# Patient Record
Sex: Female | Born: 1985 | Race: White | Hispanic: No | Smoking: Current every day smoker
Health system: Southern US, Community
[De-identification: ages and names within clinical notes are randomized; demographics above are authoritative.]

## PROBLEM LIST (undated history)

## (undated) DIAGNOSIS — IMO0002 Reserved for concepts with insufficient information to code with codable children: Secondary | ICD-10-CM

## (undated) DIAGNOSIS — M329 Systemic lupus erythematosus, unspecified: Secondary | ICD-10-CM

## (undated) DIAGNOSIS — M1990 Unspecified osteoarthritis, unspecified site: Secondary | ICD-10-CM

## (undated) DIAGNOSIS — M48061 Spinal stenosis, lumbar region without neurogenic claudication: Secondary | ICD-10-CM

## (undated) DIAGNOSIS — B182 Chronic viral hepatitis C: Secondary | ICD-10-CM

## (undated) HISTORY — PX: CHOLECYSTECTOMY: SHX55

## (undated) HISTORY — PX: LIVER BIOPSY: SHX301

---

## 2015-08-18 ENCOUNTER — Emergency Department (HOSPITAL_COMMUNITY): Payer: Medicaid - Out of State

## 2015-08-18 ENCOUNTER — Encounter (HOSPITAL_COMMUNITY): Payer: Self-pay

## 2015-08-18 ENCOUNTER — Emergency Department (HOSPITAL_COMMUNITY)
Admission: EM | Admit: 2015-08-18 | Discharge: 2015-08-18 | Disposition: A | Discharge status: Home / Self Care | Payer: Medicaid - Out of State | Attending: Emergency Medicine | Admitting: Emergency Medicine

## 2015-08-18 DIAGNOSIS — S300XXA Contusion of lower back and pelvis, initial encounter: Secondary | ICD-10-CM | POA: Diagnosis not present

## 2015-08-18 DIAGNOSIS — F17210 Nicotine dependence, cigarettes, uncomplicated: Secondary | ICD-10-CM | POA: Diagnosis not present

## 2015-08-18 DIAGNOSIS — T148XXA Other injury of unspecified body region, initial encounter: Secondary | ICD-10-CM

## 2015-08-18 DIAGNOSIS — Z8739 Personal history of other diseases of the musculoskeletal system and connective tissue: Secondary | ICD-10-CM | POA: Insufficient documentation

## 2015-08-18 DIAGNOSIS — W010XXA Fall on same level from slipping, tripping and stumbling without subsequent striking against object, initial encounter: Secondary | ICD-10-CM | POA: Insufficient documentation

## 2015-08-18 DIAGNOSIS — T148 Other injury of unspecified body region: Secondary | ICD-10-CM | POA: Diagnosis not present

## 2015-08-18 DIAGNOSIS — Z8619 Personal history of other infectious and parasitic diseases: Secondary | ICD-10-CM | POA: Diagnosis not present

## 2015-08-18 DIAGNOSIS — Y998 Other external cause status: Secondary | ICD-10-CM | POA: Diagnosis not present

## 2015-08-18 DIAGNOSIS — Y92003 Bedroom of unspecified non-institutional (private) residence as the place of occurrence of the external cause: Secondary | ICD-10-CM | POA: Insufficient documentation

## 2015-08-18 DIAGNOSIS — S20212A Contusion of left front wall of thorax, initial encounter: Secondary | ICD-10-CM | POA: Insufficient documentation

## 2015-08-18 DIAGNOSIS — Y9389 Activity, other specified: Secondary | ICD-10-CM | POA: Diagnosis not present

## 2015-08-18 DIAGNOSIS — S3992XA Unspecified injury of lower back, initial encounter: Secondary | ICD-10-CM | POA: Diagnosis present

## 2015-08-18 HISTORY — DX: Chronic viral hepatitis C: B18.2

## 2015-08-18 HISTORY — DX: Spinal stenosis, lumbar region without neurogenic claudication: M48.061

## 2015-08-18 HISTORY — DX: Systemic lupus erythematosus, unspecified: M32.9

## 2015-08-18 HISTORY — DX: Reserved for concepts with insufficient information to code with codable children: IMO0002

## 2015-08-18 HISTORY — DX: Unspecified osteoarthritis, unspecified site: M19.90

## 2015-08-18 LAB — POC URINE PREG, ED: Preg Test, Ur: NEGATIVE

## 2015-08-18 MED ORDER — METHOCARBAMOL 500 MG PO TABS: 500.0000 mg | ORAL_TABLET | Freq: Three times a day (TID) | ORAL | Status: AC

## 2015-08-18 MED ORDER — OXYCODONE HCL 5 MG PO TABS: 5.0000 mg | ORAL_TABLET | Freq: Four times a day (QID) | ORAL | Status: AC | PRN

## 2015-08-18 MED ORDER — PROMETHAZINE HCL 12.5 MG PO TABS
25.0000 mg | ORAL_TABLET | Freq: Once | ORAL | Status: DC
  Filled 2015-08-18: qty 2

## 2015-08-18 MED ORDER — MORPHINE SULFATE (PF) 10 MG/ML IV SOLN
10.0000 mg | Freq: Once | INTRAVENOUS | Status: AC
  Administered 2015-08-18: 10 mg via INTRAMUSCULAR
  Filled 2015-08-18: qty 1

## 2015-08-18 MED ORDER — DIAZEPAM 5 MG PO TABS
10.0000 mg | ORAL_TABLET | Freq: Once | ORAL | Status: AC
  Administered 2015-08-18: 10 mg via ORAL
  Filled 2015-08-18: qty 2

## 2015-08-18 NOTE — Discharge Instructions (Signed)
Your vital signs are nonacute. The x-rays of your ribs, chest, lumbar spine, and thoracic spine are all negative for acute changes. Please rest your back is much as possible. Please use Robaxin 3 times daily, may use Roxicodone for more severe pain. This medication may cause drowsiness, and/or constipation. Please use a stool softener when taking this medication. Please see your primary care physician, or the physicians at the [facility] to establish a primary care physician and assist with any follow-up. Contusion A contusion is a deep bruise. Contusions are the result of a blunt injury to tissues and muscle fibers under the skin. The injury causes bleeding under the skin. The skin overlying the contusion may turn blue, purple, or yellow. Minor injuries will give you a painless contusion, but more severe contusions may stay painful and swollen for a few weeks.  CAUSES  This condition is usually caused by a blow, trauma, or direct force to an area of the body. SYMPTOMS  Symptoms of this condition include:  Swelling of the injured area.  Pain and tenderness in the injured area.  Discoloration. The area may have redness and then turn blue, purple, or yellow. DIAGNOSIS  This condition is diagnosed based on a physical exam and medical history. An X-ray, CT scan, or MRI may be needed to determine if there are any associated injuries, such as broken bones (fractures). TREATMENT  Specific treatment for this condition depends on what area of the body was injured. In general, the best treatment for a contusion is resting, icing, applying pressure to (compression), and elevating the injured area. This is often called the RICE strategy. Over-the-counter anti-inflammatory medicines may also be recommended for pain control.  HOME CARE INSTRUCTIONS   Rest the injured area.  If directed, apply ice to the injured area:  Put ice in a plastic bag.  Place a towel between your skin and the  bag.  Leave the ice on for 20 minutes, 2-3 times per day.  If directed, apply light compression to the injured area using an elastic bandage. Make sure the bandage is not wrapped too tightly. Remove and reapply the bandage as directed by your health care provider.  If possible, raise (elevate) the injured area above the level of your heart while you are sitting or lying down.  Take over-the-counter and prescription medicines only as told by your health care provider. SEEK MEDICAL CARE IF:  Your symptoms do not improve after several days of treatment.  Your symptoms get worse.  You have difficulty moving the injured area. SEEK IMMEDIATE MEDICAL CARE IF:   You have severe pain.  You have numbness in a hand or foot.  Your hand or foot turns pale or cold.   This information is not intended to replace advice given to you by your health care provider. Make sure you discuss any questions you have with your health care provider.   Document Released: 03/11/2005 Document Revised: 02/20/2015 Document Reviewed: 10/17/2014 Elsevier Interactive Patient Education [employer]2016 [employer]

## 2015-08-18 NOTE — ED Notes (Signed)
Pt reports a miscarriage 2 weeks ago and declines a urine pregnancy test

## 2015-08-18 NOTE — ED Provider Notes (Signed)
CSN: [id number]     Arrival date & time 08/18/15  2012 History   First MD Initiated Contact with Patient 08/18/15 2113     Chief Complaint  Patient presents with  . Back Pain     (Consider location/radiation/quality/duration/timing/severity/associated sxs/prior Treatment) HPI Comments: Patient is a 30 year old female who presents to the emergency department with complaint of back pain and rib pain.  The patient states that on last evening she tripped in a bedroom, and fell on a table, trash can, and heater. She did not have immediate pain, but as the night progressed she had more and more pain. This a.m. she could hardly move, and has pain with movement, pain with walking. She has not had any vomiting. She did not have any loss of consciousness on last night. There's been no blood in the urine. There's been no blood in the stool. The patient denies any use of anticoagulation medications. It is of note that she has a history of lumbar stenosis and arthritis.  Patient is a 30 y.o. female presenting with back pain. The history is provided by the patient.  Back Pain Location:  Thoracic spine and lumbar spine   Past Medical History  Diagnosis Date  . Lupus (HCC)   . Lumbar stenosis   . Arthritis   . Hep C w/o coma, chronic (HCC)    Past Surgical History  Procedure Laterality Date  . Cesarean section    . Cholecystectomy    . Liver biopsy     History reviewed. No pertinent family history. Social History  Substance Use Topics  . Smoking status: Current Every Day Smoker    Types: Cigarettes  . Smokeless tobacco: None  . Alcohol Use: Yes     Comment: occ   OB History    No data available     Review of Systems  Musculoskeletal: Positive for back pain and arthralgias.  All other systems reviewed and are negative.     Allergies  Nsaids; Tramadol; and Tylenol  Home Medications   Prior to Admission medications   Not on File   BP 114/51 mmHg  Pulse 100  Temp(Src) 97.8 F  (36.6 C) (Oral)  Resp 18  Ht 5\' 1"  (1.549 m)  Wt 79.379 kg  BMI 33.08 kg/m2  SpO2 100%  LMP 08/12/2015 Physical Exam  Constitutional: She is oriented to person, place, and time. She appears well-developed and well-nourished.  Non-toxic appearance.  HENT:  Head: Normocephalic.  Right Ear: Tympanic membrane and external ear normal.  Left Ear: Tympanic membrane and external ear normal.  Eyes: EOM and lids are normal. Pupils are equal, round, and reactive to light.  Neck: Normal range of motion. Neck supple. Carotid bruit is not present.  Cardiovascular: Normal rate, regular rhythm, normal heart sounds, intact distal pulses and normal pulses.   Pulmonary/Chest: Breath sounds normal. No respiratory distress.  There is pain to the left lateral/posterior ribs. There is symmetrical rise and fall of the chest. The patient speaks in complete sentences.  Abdominal: Soft. Bowel sounds are normal. There is no tenderness. There is no guarding.  Musculoskeletal: Normal range of motion.  There is pain to palpation of the lower thoracic, entire lumbar area. There is spasm noted of the paraspinal muscle areas of the lumbar region. The examination is limited, but there is no palpable step off. Patient has complaint of pain with even light touch to the lower back.  Lymphadenopathy:       Head (right side): No submandibular  adenopathy present.       Head (left side): No submandibular adenopathy present.    She has no cervical adenopathy.  Neurological: She is alert and oriented to person, place, and time. She has normal strength. No cranial nerve deficit or sensory deficit. She exhibits normal muscle tone. Coordination normal.  Patient would not cooperate for ambulation. No gross deficits appreciated on limited examination.  Skin: Skin is warm and dry.  Psychiatric: She has a normal mood and affect. Her speech is normal.  Nursing note and vitals reviewed.   ED Course  Procedures (including critical care  time) Labs Review Labs Reviewed  POC URINE PREG, ED    Imaging Review Dg Ribs Unilateral W/chest Left  08/18/2015  CLINICAL DATA:  Fall with left-sided rib pain. Patient tripped and fell onto a table yesterday. EXAM: LEFT RIBS AND CHEST - 3+ VIEW COMPARISON:  None. FINDINGS: Normal heart size and pulmonary vascularity. No focal airspace disease or consolidation in the lungs. No blunting of costophrenic angles. No pneumothorax. Mediastinal contours appear intact. Left ribs appear intact. No evidence of acute fracture or dislocation. No focal bone lesion or bone destruction. IMPRESSION: No evidence of active pulmonary disease.  Negative left ribs. Electronically Signed   By: Burman Nieves M.D.   On: 08/18/2015 22:33   Dg Thoracic Spine 2 View  08/18/2015  CLINICAL DATA:  Trip and fall onto the table, trash can, and space heater yesterday. Now with thoracolumbar back pain today. EXAM: THORACIC SPINE 2 VIEWS COMPARISON:  None. FINDINGS: The alignment is maintained. Vertebral body heights are maintained. No significant disc space narrowing. Posterior elements appear intact. No evidence of fracture. There is no paravertebral soft tissue abnormality. IMPRESSION: Negative radiographs of the thoracic spine. Electronically Signed   By: Rubye Oaks M.D.   On: 08/18/2015 22:34   Dg Lumbar Spine Complete  08/18/2015  CLINICAL DATA:  Trip and fall onto a table, trash can, and space heater yesterday. Now with thoracic and lumbar back pain. EXAM: LUMBAR SPINE - COMPLETE 4+ VIEW COMPARISON:  None. FINDINGS: The alignment is maintained. Vertebral body heights are normal. There is no listhesis. The posterior elements are intact. Minimal disc space narrowing at L5-S1, remaining disc spaces are preserved spaces are preserved. No fracture. Sacroiliac joints are symmetric and normal. IMPRESSION: No fracture or subluxation of the lumbar spine. Electronically Signed   By: Rubye Oaks M.D.   On: 08/18/2015 22:33   I  have personally reviewed and evaluated these images and lab results as part of my medical decision-making.   EKG Interpretation None      MDM  Vital signs are well within normal limits. Pulse oximetry is 100% on room air. X-ray of the left ribs and chest is read as negative. X-ray of the thoracic spine is read as negative for fracture or dislocation. X-ray of the lumbar spine is read as negative for fracture or subluxation. The patient will be treated with muscle relaxer and Roxicodone, as the patient has multiple allergies to medications.    Final diagnoses:  None    *I have reviewed nursing notes, vital signs, and all appropriate lab and imaging results for this patient.Ivery Quale, PA-C 08/18/15 2247  Samuel Jester, DO 08/21/15 [phone]

## 2015-08-18 NOTE — ED Notes (Signed)
Patient states she tripped and fell onto a table, trash can, and a space heater. Denies LOC, c/o mid/lower back.

## 2017-06-02 IMAGING — DX DG RIBS W/ CHEST 3+V*L*
4 series · 4 of 4 positions shown · non-contrast
Comparison: None.

CLINICAL DATA: Fall with left-sided rib pain. Patient tripped and
fell onto a table yesterday.

EXAM:
LEFT RIBS AND CHEST - 3+ VIEW

[chest pa]
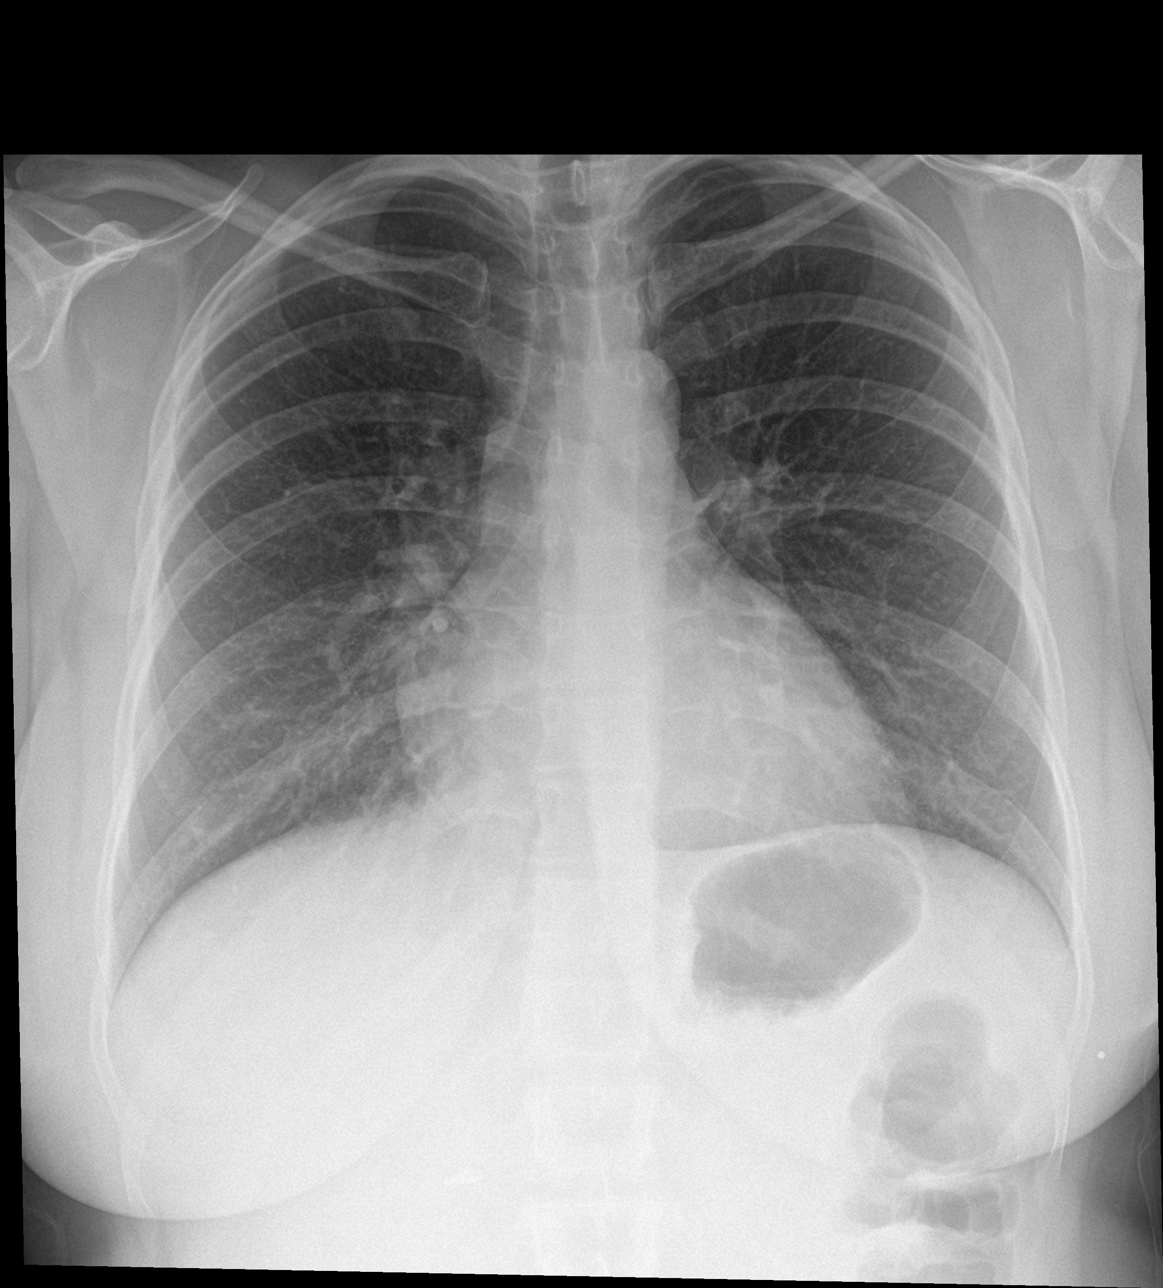

[rib pa obl (1 of 2)]
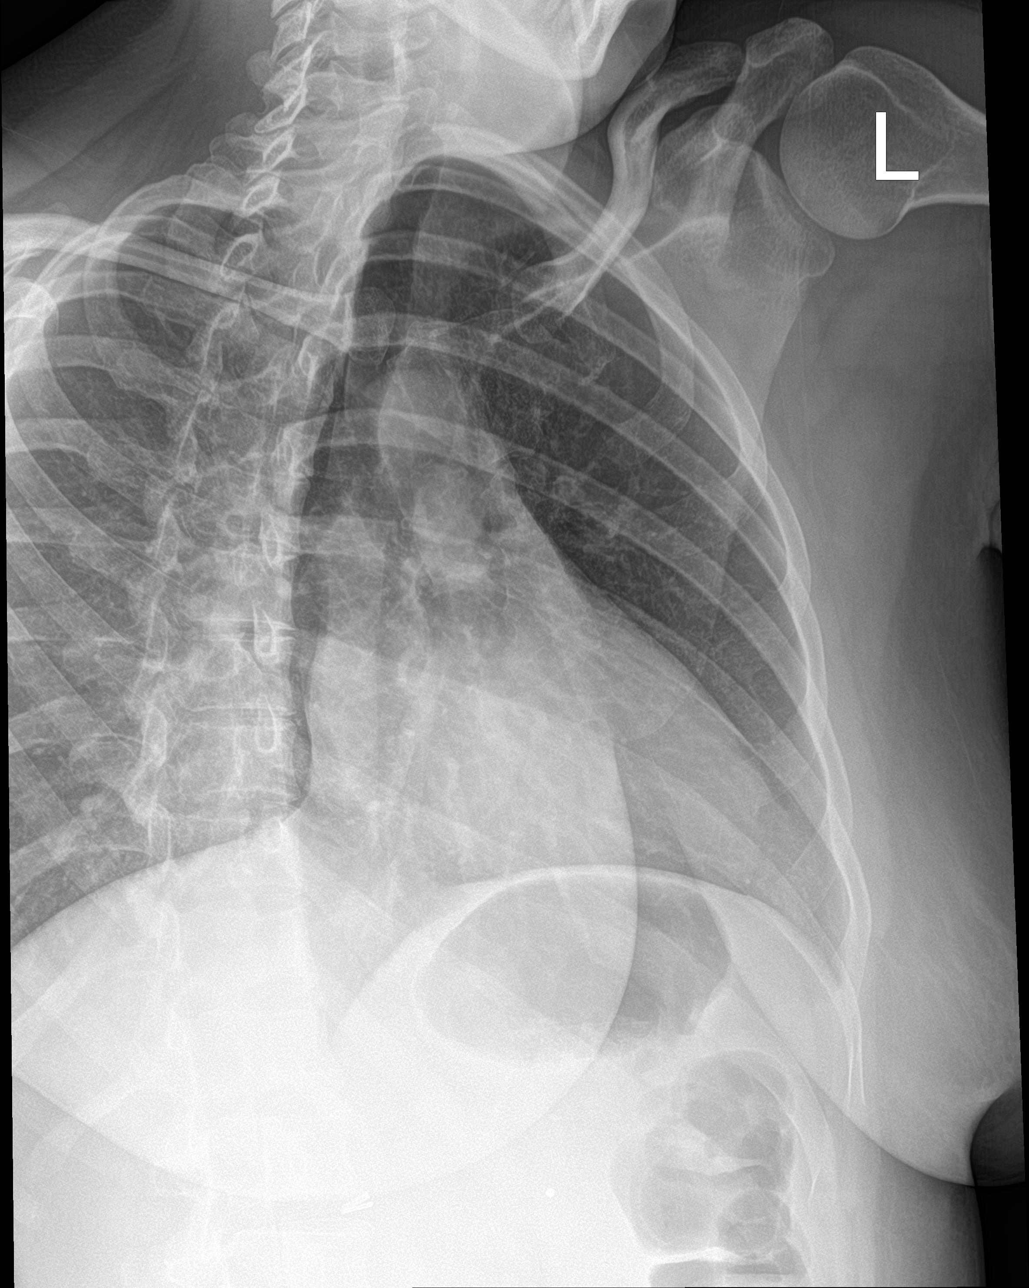

[rib pa obl (2 of 2)]
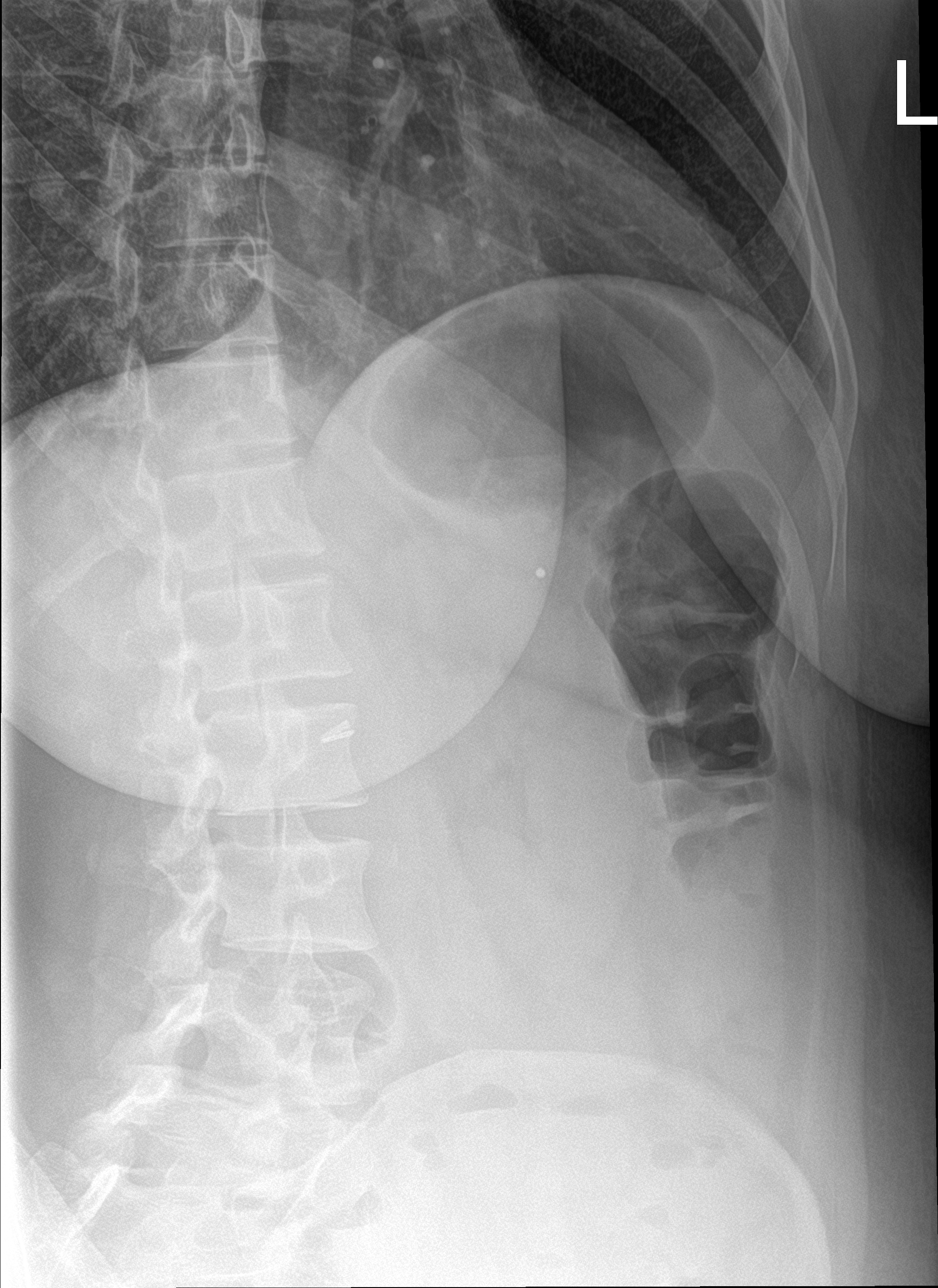

[rib pa]
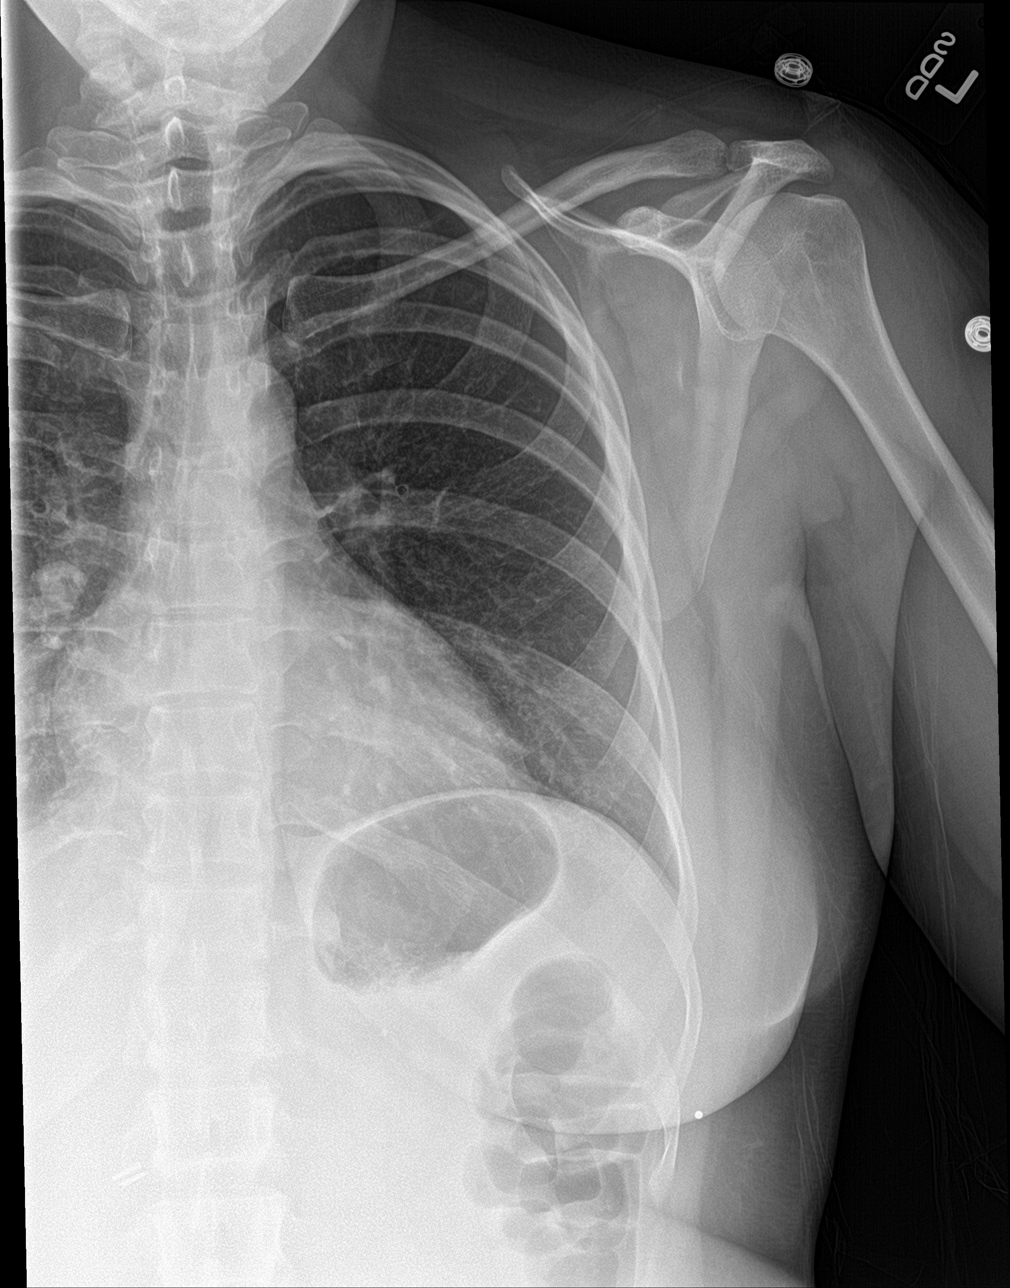

[4 of 4 positions shown; findings below may reference images not displayed]

FINDINGS: Normal heart size and pulmonary vascularity. No focal airspace
disease or consolidation in the lungs. No blunting of costophrenic
angles. No pneumothorax. Mediastinal contours appear intact.

Left ribs appear intact. No evidence of acute fracture or
dislocation. No focal bone lesion or bone destruction.
IMPRESSION: No evidence of active pulmonary disease.  Negative left ribs.
